# Patient Record
Sex: Female | Born: 1980 | Race: White | Hispanic: No | Marital: Single
Health system: Southern US, Community
[De-identification: ages and names within clinical notes are randomized; demographics above are authoritative.]

## PROBLEM LIST (undated history)

## (undated) HISTORY — PX: AUGMENTATION MAMMAPLASTY: SUR837

---

## 2020-06-08 DIAGNOSIS — Z803 Family history of malignant neoplasm of breast: Secondary | ICD-10-CM | POA: Insufficient documentation

## 2020-06-08 DIAGNOSIS — N941 Unspecified dyspareunia: Secondary | ICD-10-CM | POA: Insufficient documentation

## 2020-06-08 DIAGNOSIS — N946 Dysmenorrhea, unspecified: Secondary | ICD-10-CM | POA: Insufficient documentation

## 2020-07-24 ENCOUNTER — Ambulatory Visit: Payer: BC Managed Care – PPO | Admitting: Podiatry

## 2020-08-04 ENCOUNTER — Ambulatory Visit: Payer: BC Managed Care – PPO | Admitting: Podiatry

## 2020-08-17 DIAGNOSIS — I83893 Varicose veins of bilateral lower extremities with other complications: Secondary | ICD-10-CM | POA: Insufficient documentation

## 2020-09-04 ENCOUNTER — Ambulatory Visit: Payer: BC Managed Care – PPO | Admitting: Podiatry

## 2020-09-04 ENCOUNTER — Encounter: Payer: Self-pay | Admitting: Podiatry

## 2020-09-04 ENCOUNTER — Ambulatory Visit (INDEPENDENT_AMBULATORY_CARE_PROVIDER_SITE_OTHER): Payer: BC Managed Care – PPO

## 2020-09-04 ENCOUNTER — Other Ambulatory Visit: Payer: Self-pay

## 2020-09-04 DIAGNOSIS — M21612 Bunion of left foot: Secondary | ICD-10-CM

## 2020-09-04 DIAGNOSIS — E161 Other hypoglycemia: Secondary | ICD-10-CM | POA: Insufficient documentation

## 2020-09-04 DIAGNOSIS — M21611 Bunion of right foot: Secondary | ICD-10-CM | POA: Diagnosis not present

## 2020-09-04 DIAGNOSIS — F32A Depression, unspecified: Secondary | ICD-10-CM | POA: Insufficient documentation

## 2020-09-04 DIAGNOSIS — H539 Unspecified visual disturbance: Secondary | ICD-10-CM | POA: Insufficient documentation

## 2020-09-04 DIAGNOSIS — B351 Tinea unguium: Secondary | ICD-10-CM

## 2020-09-04 DIAGNOSIS — I10 Essential (primary) hypertension: Secondary | ICD-10-CM | POA: Insufficient documentation

## 2020-09-04 NOTE — Patient Instructions (Signed)
The surgery we talked about is called a "Lapidus bunionectomy"

## 2020-09-04 NOTE — Progress Notes (Signed)
Subjective:   Patient ID: Renee Garcia, female   DOB: 39 y.o.   MRN: [id number]   HPI 39 year old female presents the office today for concerns of nail fungus.  She is interested in laser therapy for this.  She has had fungus toenails for some time in shoes.  She had laser performed as well as other medication options.  Denies any pain in the nails any redness or drainage.  She does have swelling but see note below from vascular surgery.  Also she has other concerns about cognition with her feet.  Left side is worse than the right.  No recent treatment but she has seen other doctors for this and they discussed surgical options.  Patient was interested initially in a lipiplasty but when she saw another physician for this they discussed also bunionectomy it sounds like.  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 There was thrombus in some great saphenous vein tributary branches in her left medial leg.     Review of Systems  All other systems reviewed and are negative.  History reviewed. No pertinent past medical history.  History reviewed. No pertinent surgical history.   Current Outpatient Medications:  .  betamethasone dipropionate (DIPROLENE) 0.05 % ointment, Apply topically., Disp: , Rfl:  .  ketoconazole (NIZORAL) 2 % cream, , Disp: , Rfl:  .  meloxicam (MOBIC) 15 MG tablet, Take by mouth., Disp: , Rfl:  .  acarbose (PRECOSE) 50 MG tablet, acarbose 50 mg tablet Take 1 tablet 3 times a day by oral route for 90 days., Disp: , Rfl:  .  Buprenorphine HCl-Naloxone HCl 8-2 MG FILM, Suboxone 8 mg-2 mg sublingual film Place 1 film twice a day by sublingual route for 30 days., Disp: , Rfl:  .  buPROPion (WELLBUTRIN XL) 150 MG 24 hr tablet, Take 150 mg by mouth daily., Disp: , Rfl:  .   chlorhexidine (PERIDEX) 0.12 % solution, Peridex 0.12 % mouthwash, Disp: , Rfl:  .  cyclobenzaprine (FLEXERIL) 10 MG tablet, Take by mouth., Disp: , Rfl:  .  ibuprofen (ADVIL) 800 MG tablet, Take by mouth., Disp: , Rfl:  .  nebivolol (BYSTOLIC) 5 MG tablet, Bystolic 5 mg tablet TAKE 1 TABLET BY MOUTH TWICE A DAY AFTER MEALS, Disp: , Rfl:  .  neomycin-polymyxin b-dexamethasone (MAXITROL) 3.5-10000-0.1 OINT, SMARTSIG:Sparingly In Eye(s) Twice Daily, Disp: , Rfl:  .  tretinoin (RETIN-A) 0.025 % gel, Retin-A 0.025 % topical gel APPLY TO AFFECTED AREA EVERY DAY AT BEDTIME, Disp: , Rfl:  .  triamcinolone cream (KENALOG) 0.5 %, triamcinolone acetonide 0.5 % topical cream Apply 1 application 3 times a day by topical route for 30 days., Disp: , Rfl:   Allergies  Allergen Reactions  . Sulfa Antibiotics Hives         Objective:  Physical Exam  General: AAO x3, NAD  Dermatological: The bilateral hallux nails there is mild hypertrophy noted.  There is yellow to brown discoloration.  There is no drainage or pus or any surrounding edema, erythema.  Is no open lesions.  Vascular: Dorsalis Pedis artery and Posterior Tibial artery pedal pulses are 2/4 bilateral with immedate capillary fill time.   There is no pain with calf compression, swelling, warmth, erythema.   Neruologic: Grossly intact via light touch bilateral.   Musculoskeletal: There is a moderate bunion deformity present  on the left side with hypermobility present first ray.  There is no pain or crepitation with ankle joint range of motion.  Flexor, extensor tendons appear to be intact.  Muscular strength 5/5 in all groups tested bilateral.  Gait: Unassisted, Nonantalgic.       Assessment:   Onychomycosis, left foot symptomatic bunion    Plan:  -Treatment options discussed including all alternatives, risks, and complications -Etiology of symptoms were discussed -Discussed treatment options for nail fungus and she was to proceed with  laser.  Discussed with success rates this is not a guarantee of resolution of symptoms.  She can be scheduled for laser. -Regards the bunions x-rays were obtained and reviewed.  Moderate bunion deformities present.  However given her hypermobility the first ray in the left side I would recommend a Lapidus bunionectomy.  Discussed the surgical's postoperative course.  Vivi Barrack DPM

## 2020-09-06 DIAGNOSIS — M21612 Bunion of left foot: Secondary | ICD-10-CM | POA: Insufficient documentation

## 2020-09-06 DIAGNOSIS — M21611 Bunion of right foot: Secondary | ICD-10-CM | POA: Insufficient documentation

## 2020-09-06 DIAGNOSIS — B351 Tinea unguium: Secondary | ICD-10-CM | POA: Insufficient documentation

## 2020-09-11 ENCOUNTER — Other Ambulatory Visit: Payer: BC Managed Care – PPO

## 2020-09-15 ENCOUNTER — Other Ambulatory Visit: Payer: Self-pay | Admitting: Gynecology

## 2020-09-15 DIAGNOSIS — L299 Pruritus, unspecified: Secondary | ICD-10-CM

## 2020-09-18 ENCOUNTER — Other Ambulatory Visit: Payer: Self-pay | Admitting: Gynecology

## 2020-09-18 DIAGNOSIS — L299 Pruritus, unspecified: Secondary | ICD-10-CM

## 2020-09-19 ENCOUNTER — Other Ambulatory Visit: Payer: Self-pay

## 2020-09-19 ENCOUNTER — Ambulatory Visit (INDEPENDENT_AMBULATORY_CARE_PROVIDER_SITE_OTHER): Payer: BC Managed Care – PPO | Admitting: *Deleted

## 2020-09-19 DIAGNOSIS — B351 Tinea unguium: Secondary | ICD-10-CM

## 2020-09-19 NOTE — Progress Notes (Signed)
Patient presents today for the 1st laser treatment. Diagnosed with mycotic nail infection by Dr. Ardelle Anton.   Toenail most affected hallux nails bilateral, mild discoloration.  All other systems are negative.  Nails were filed thin. Laser therapy was administered to 1st toenails bilateral and patient tolerated the treatment well. All safety precautions were in place.    Follow up in 4 weeks for laser # 2.  Picture of nails taken today to document visual progress

## 2020-09-19 NOTE — Patient Instructions (Signed)

## 2020-10-03 ENCOUNTER — Other Ambulatory Visit: Payer: Self-pay | Admitting: Gynecology

## 2020-10-03 ENCOUNTER — Ambulatory Visit
Admission: RE | Admit: 2020-10-03 | Discharge: 2020-10-03 | Disposition: A | Discharge status: Home / Self Care | Payer: BC Managed Care – PPO | Source: Ambulatory Visit | Attending: Gynecology | Admitting: Gynecology

## 2020-10-03 ENCOUNTER — Other Ambulatory Visit: Payer: Self-pay

## 2020-10-03 DIAGNOSIS — L299 Pruritus, unspecified: Secondary | ICD-10-CM

## 2020-10-03 IMAGING — MG MM  DIGITAL DIAGNOSTIC BREAST BILAT IMPLANT W/ TOMO W/ CAD
7 of 12 series · 7 of 28 positions shown · non-contrast
Comparison: None.
COMPARISON: None.

Addendum:
CLINICAL DATA: 38-year-old female presenting for baseline mammogram
to evaluate itching in the superior left breast which fluctuates
with her cycle and has been occurring for 2 years. The patient's
mother was recently diagnosed with a triple negative breast cancer,
and she may have a paternal aunt who had history of ovarian cancer.

EXAM:
DIGITAL DIAGNOSTIC BILATERAL MAMMOGRAM WITH IMPLANTS, CAD AND TOMO
The patient has retropectoral implants. Standard and implant
displaced views were performed.
BILATERAL BREAST ULTRASOUND

[L CC]
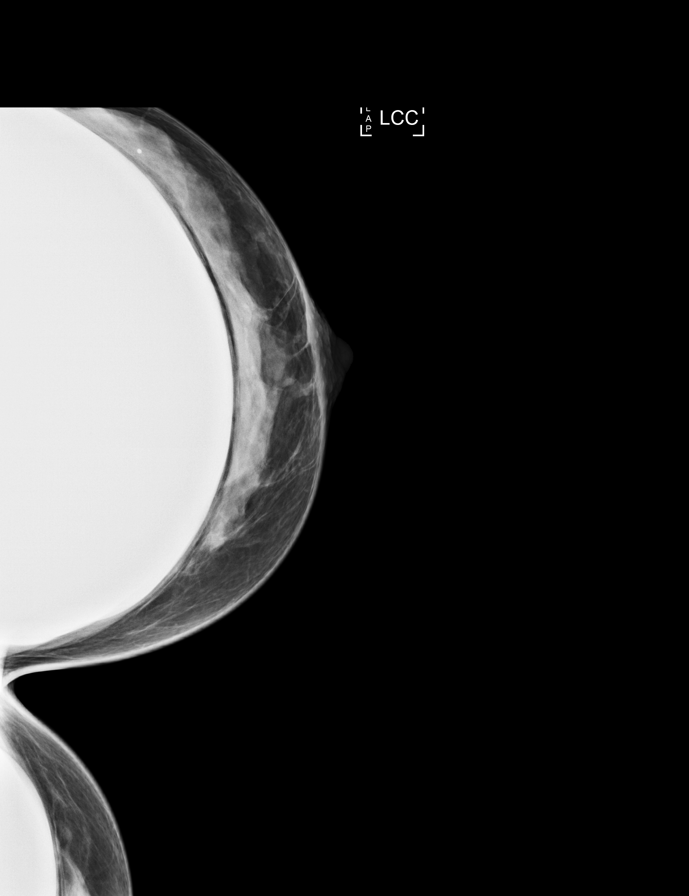

[R CC]
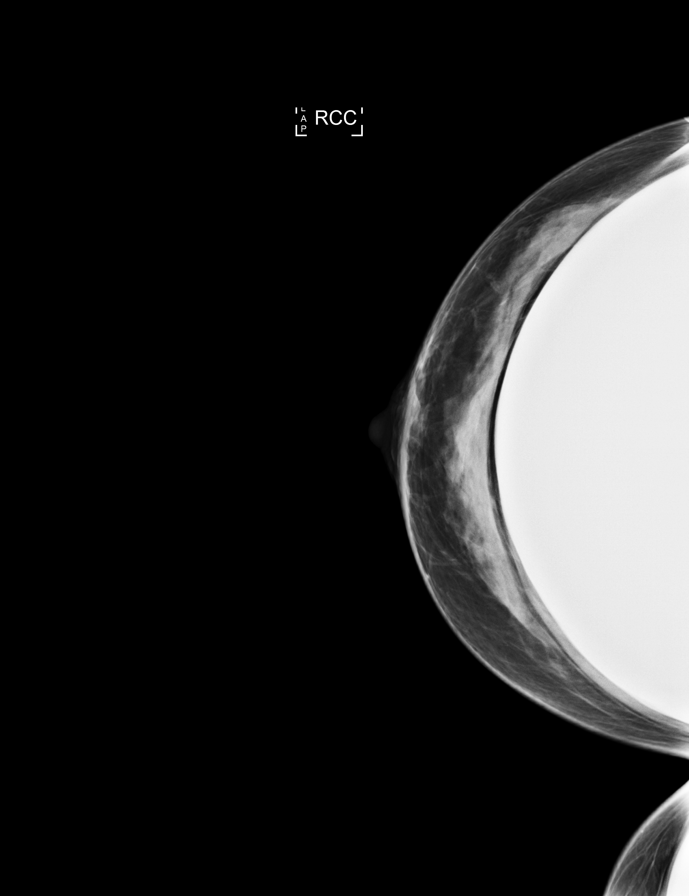

[L MLO]
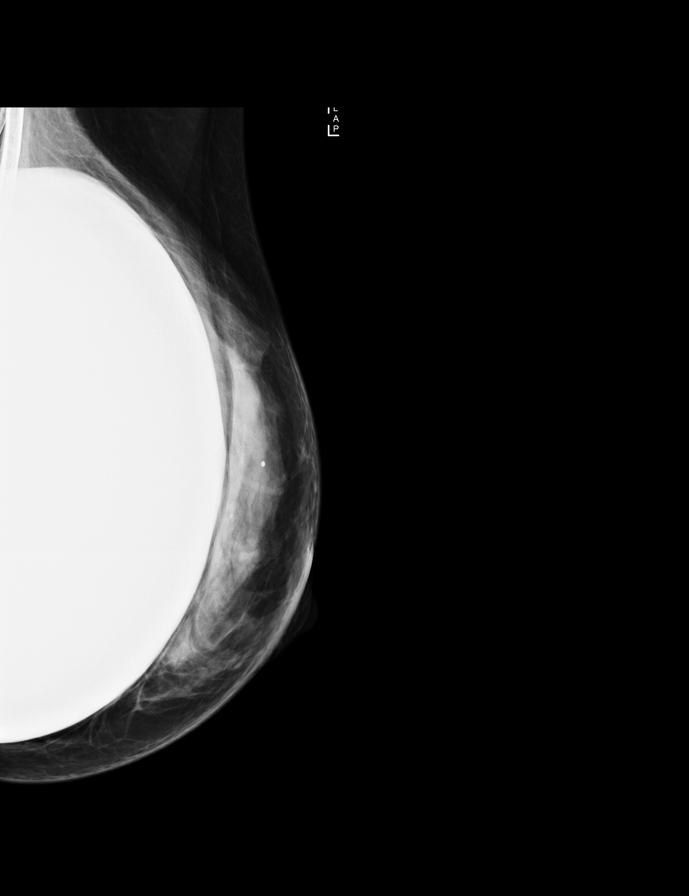

[R MLO]
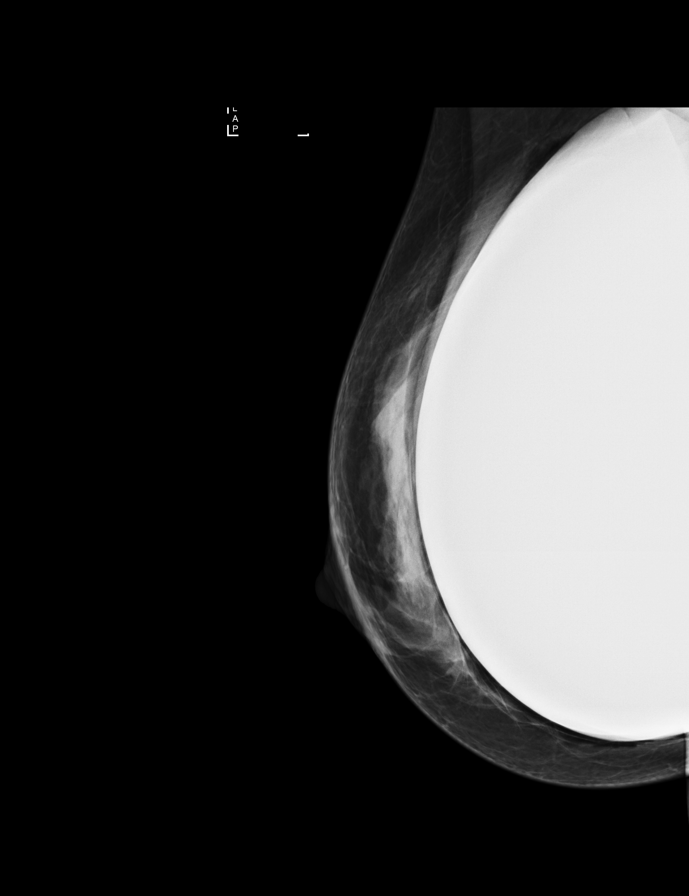

[L MLO synth-2D]
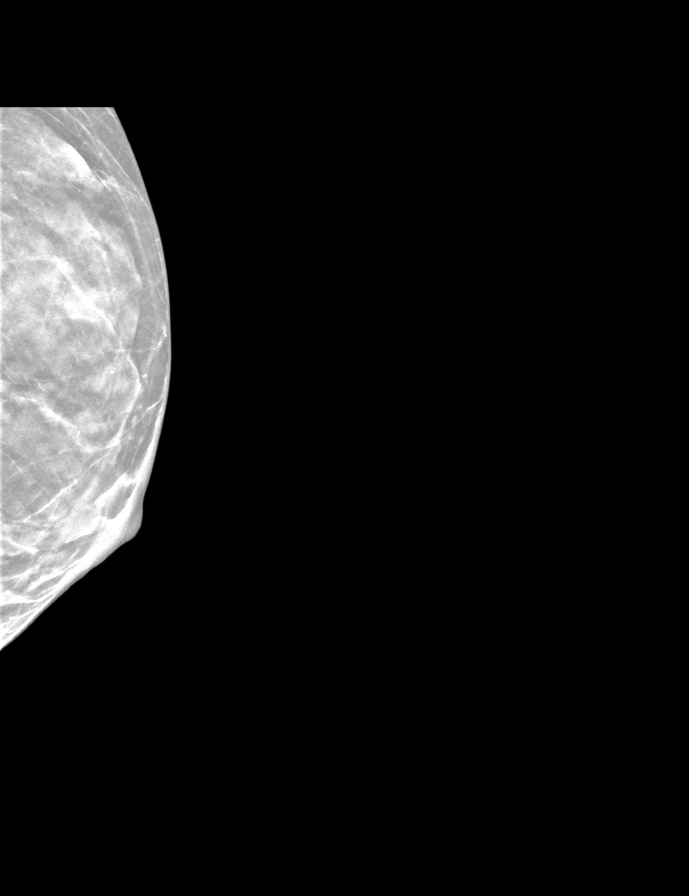

[R CC synth-2D]
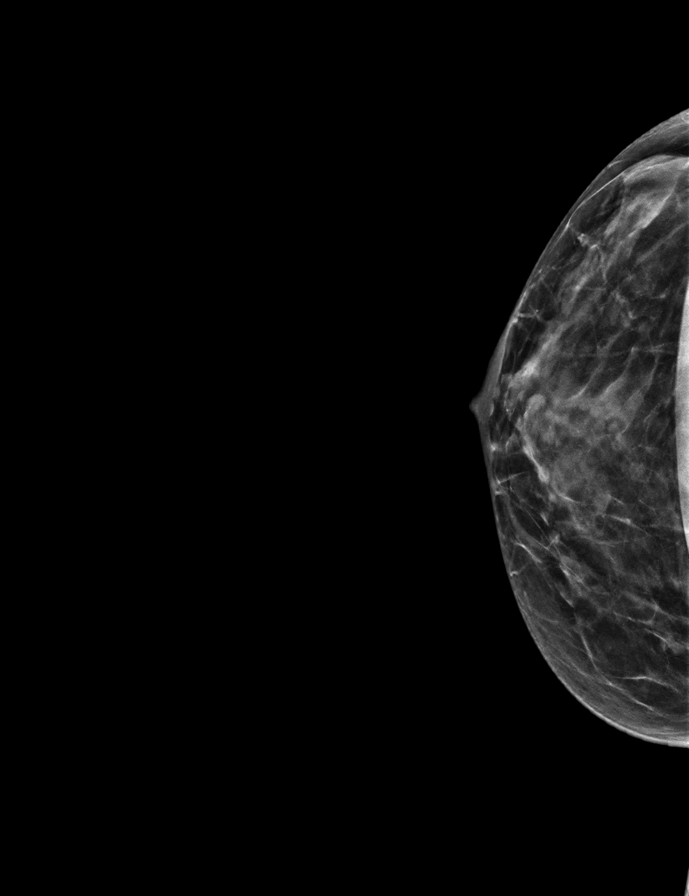

[L CC synth-2D]
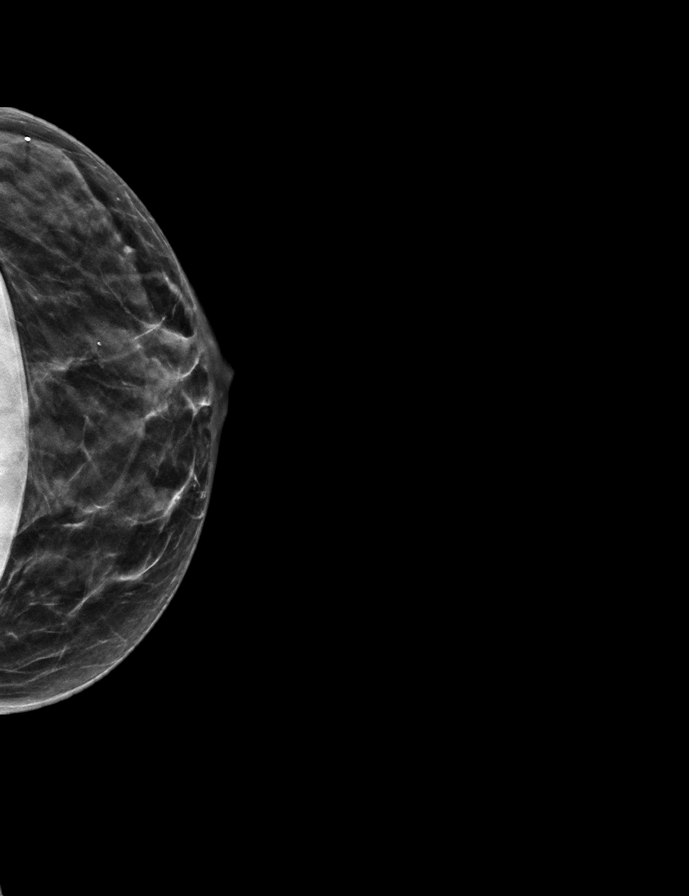

[7 of 28 positions shown; findings below may reference images not displayed]

ACR Breast Density Category c: The breast tissue is heterogeneously
dense, which may obscure small masses.
FINDINGS: In the superior retroareolar right breast there is an obscured oval
mass measuring approximately 5 mm. No other suspicious
calcifications, masses or areas of distortion are seen in the
bilateral breasts.

Mammographic images were processed with CAD.

Ultrasound targeted to the superior left breast demonstrates normal
fibroglandular tissue. No suspicious masses or areas of shadowing
are identified.

Ultrasound targeted to the superior aspect of the right breast
demonstrates 2 anechoic oval circumscribed masses measuring 4-5 mm
consistent with benign cysts.
IMPRESSION: 1. There are no suspicious mammographic or targeted sonographic
abnormalities in the region of itching in the superior left breast.

2. There are a few benign cysts in the superior aspect of the right
breast.

3.  No mammographic evidence of malignancy in the bilateral breasts.

RECOMMENDATION:
1. Clinical follow-up recommended for the region of chronic
intermittent itching in the superior left breast. Any further workup
should be based on clinical grounds.

2. Consider genetics assessment to determine the patient's lifetime
risk of breast cancer given her family history, if this has not
already been performed. Per American Cancer Society guidelines, if
the patient has a calculated lifetime risk of developing breast
cancer of greater than 20%, annual screening MRI of the breasts
would be recommended at the time of screening mammography. Given her
family history and breast tissue density, if she does not qualify
for annual high risk MRI, she may opt for annual abbreviated MRI,
understanding that this is an out of pocket expense and not covered
by insurance. This was discussed with the patient.

3. The patient states that she is concerned with implant rupture and
would like additional imaging to evaluate for this. I explained that
we recommend implant rupture protocol MRI. We can do this MRI with
contrast to screen for malignancy as well as evaluate for rupture.

I have discussed the findings and recommendations with the patient.
If applicable, a reminder letter will be sent to the patient
regarding the next appointment.

BI-RADS CATEGORY  2: Benign.

ADDENDUM:
In addition to the recommendations in the original report, screening
mammogram recommended in one year.(Code:[M0])

*** End of Addendum ***
ACR Breast Density Category c: The breast tissue is heterogeneously
dense, which may obscure small masses.
FINDINGS: In the superior retroareolar right breast there is an obscured oval
mass measuring approximately 5 mm. No other suspicious
calcifications, masses or areas of distortion are seen in the
bilateral breasts.

Mammographic images were processed with CAD.

Ultrasound targeted to the superior left breast demonstrates normal
fibroglandular tissue. No suspicious masses or areas of shadowing
are identified.

Ultrasound targeted to the superior aspect of the right breast
demonstrates 2 anechoic oval circumscribed masses measuring 4-5 mm
consistent with benign cysts.
IMPRESSION: 1. There are no suspicious mammographic or targeted sonographic
abnormalities in the region of itching in the superior left breast.

2. There are a few benign cysts in the superior aspect of the right
breast.

3.  No mammographic evidence of malignancy in the bilateral breasts.

RECOMMENDATION:
1. Clinical follow-up recommended for the region of chronic
intermittent itching in the superior left breast. Any further workup
should be based on clinical grounds.

2. Consider genetics assessment to determine the patient's lifetime
risk of breast cancer given her family history, if this has not
already been performed. Per American Cancer Society guidelines, if
the patient has a calculated lifetime risk of developing breast
cancer of greater than 20%, annual screening MRI of the breasts
would be recommended at the time of screening mammography. Given her
family history and breast tissue density, if she does not qualify
for annual high risk MRI, she may opt for annual abbreviated MRI,
understanding that this is an out of pocket expense and not covered
by insurance. This was discussed with the patient.

3. The patient states that she is concerned with implant rupture and
would like additional imaging to evaluate for this. I explained that
we recommend implant rupture protocol MRI. We can do this MRI with
contrast to screen for malignancy as well as evaluate for rupture.

I have discussed the findings and recommendations with the patient.
If applicable, a reminder letter will be sent to the patient
regarding the next appointment.

BI-RADS CATEGORY  2: Benign.

## 2020-10-03 IMAGING — US A
1 series · 10 of 10 positions shown · non-contrast
Comparison: None.
COMPARISON: None.

Addendum:
CLINICAL DATA: 38-year-old female presenting for baseline mammogram
to evaluate itching in the superior left breast which fluctuates
with her cycle and has been occurring for 2 years. The patient's
mother was recently diagnosed with a triple negative breast cancer,
and she may have a paternal aunt who had history of ovarian cancer.

EXAM:
DIGITAL DIAGNOSTIC BILATERAL MAMMOGRAM WITH IMPLANTS, CAD AND TOMO
The patient has retropectoral implants. Standard and implant
displaced views were performed.
BILATERAL BREAST ULTRASOUND

[Series 1: a · 0.06mm/px · 10 of 10 slices shown]
[im 1/10]
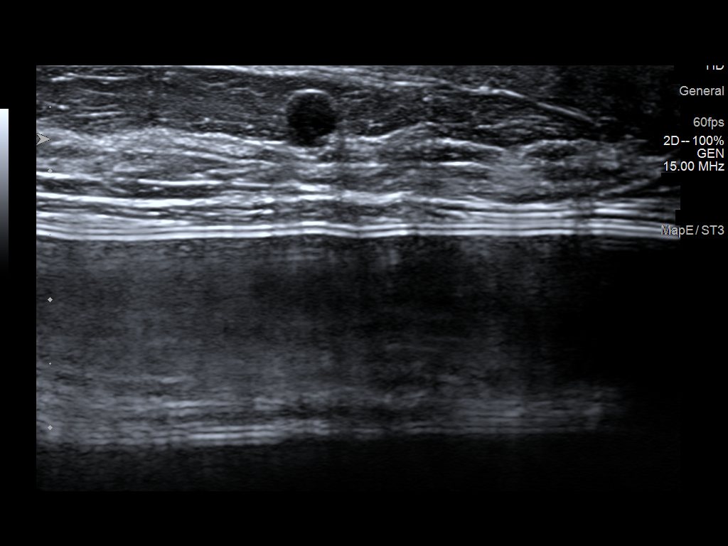
[im 2/10]
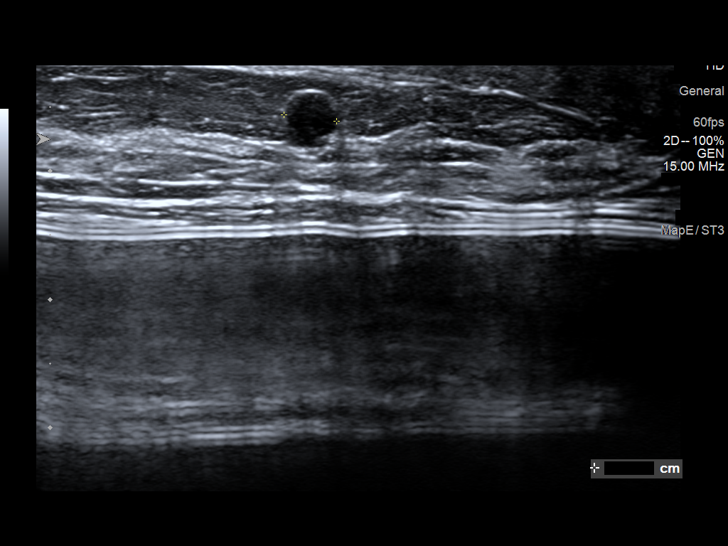
[im 3/10]
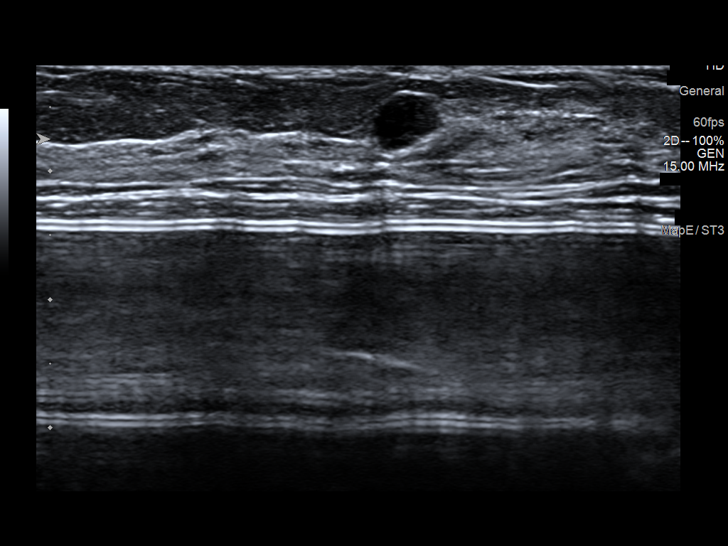
[im 4/10]
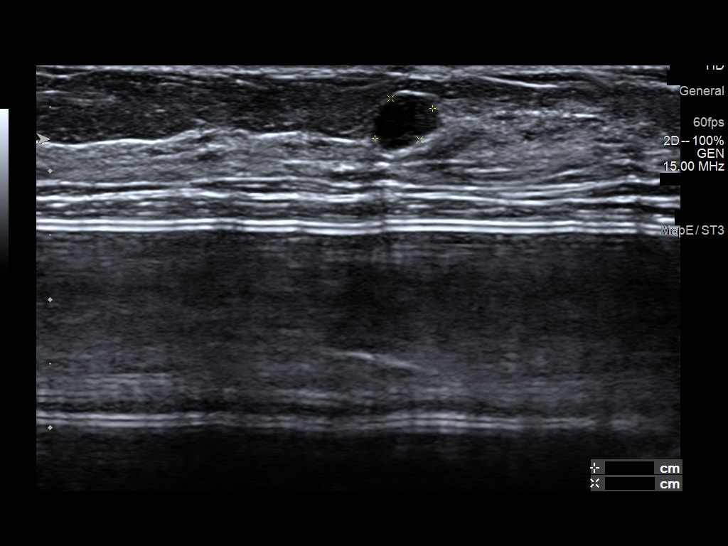
[im 5/10]
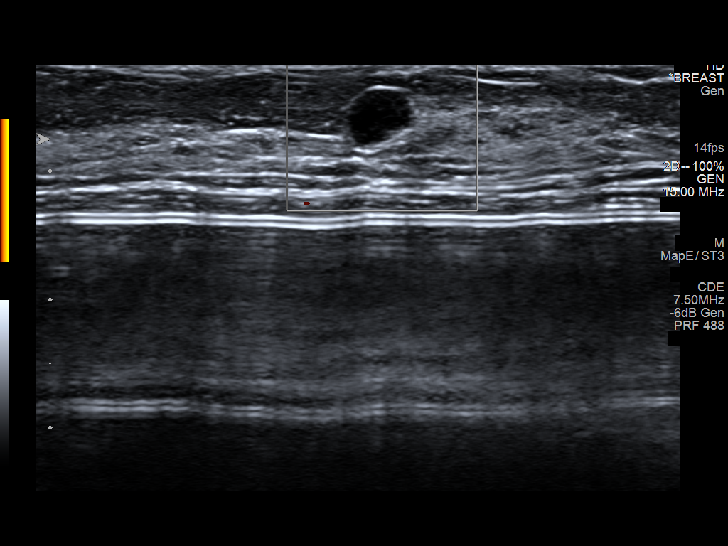
[im 6/10]
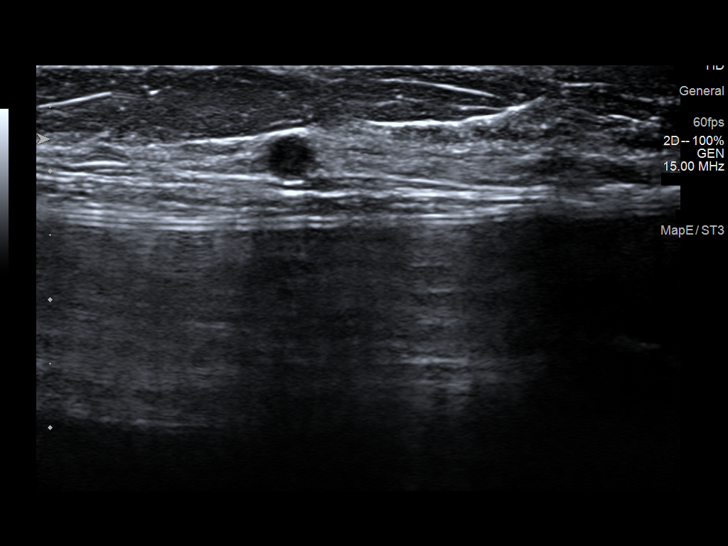
[im 7/10]
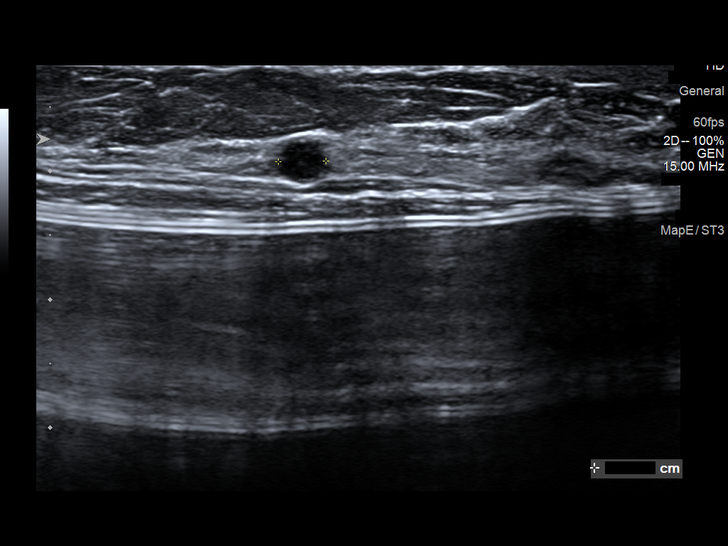
[im 8/10]
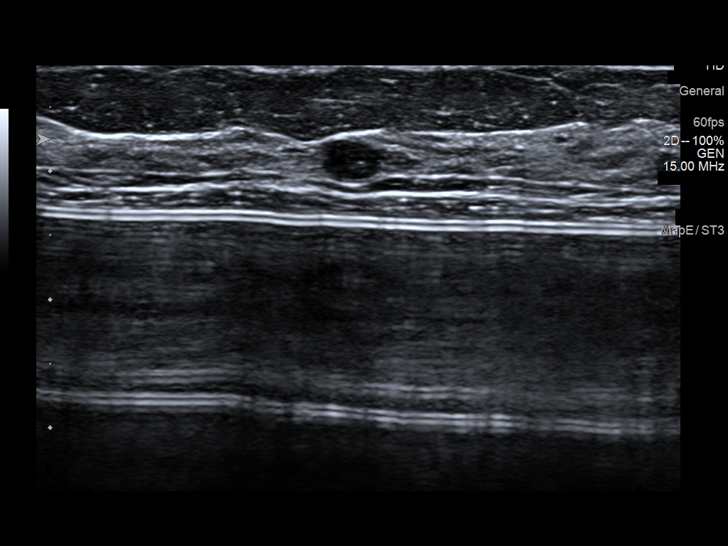
[im 9/10]
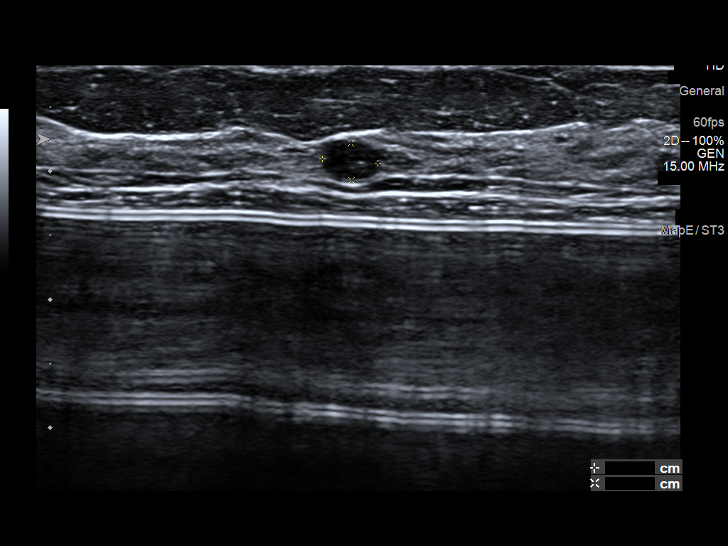
[im 10/10]
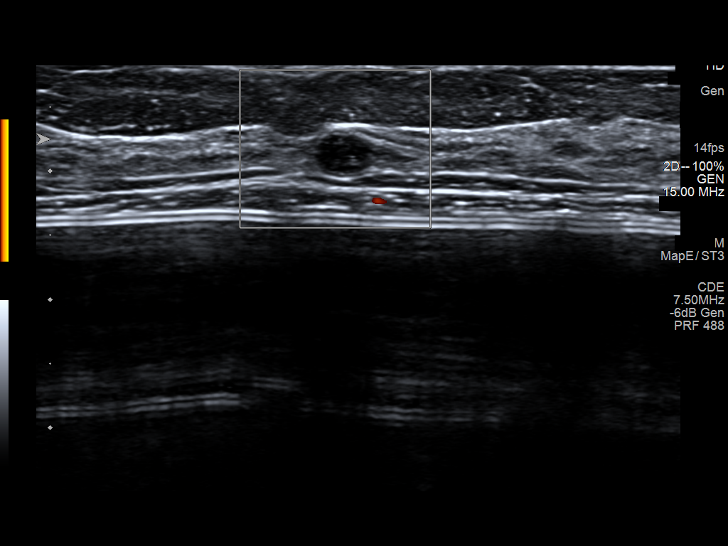

[10 of 10 positions shown; findings below may reference images not displayed]

ACR Breast Density Category c: The breast tissue is heterogeneously
dense, which may obscure small masses.
FINDINGS: In the superior retroareolar right breast there is an obscured oval
mass measuring approximately 5 mm. No other suspicious
calcifications, masses or areas of distortion are seen in the
bilateral breasts.

Mammographic images were processed with CAD.

Ultrasound targeted to the superior left breast demonstrates normal
fibroglandular tissue. No suspicious masses or areas of shadowing
are identified.

Ultrasound targeted to the superior aspect of the right breast
demonstrates 2 anechoic oval circumscribed masses measuring 4-5 mm
consistent with benign cysts.
IMPRESSION: 1. There are no suspicious mammographic or targeted sonographic
abnormalities in the region of itching in the superior left breast.

2. There are a few benign cysts in the superior aspect of the right
breast.

3.  No mammographic evidence of malignancy in the bilateral breasts.

RECOMMENDATION:
1. Clinical follow-up recommended for the region of chronic
intermittent itching in the superior left breast. Any further workup
should be based on clinical grounds.

2. Consider genetics assessment to determine the patient's lifetime
risk of breast cancer given her family history, if this has not
already been performed. Per American Cancer Society guidelines, if
the patient has a calculated lifetime risk of developing breast
cancer of greater than 20%, annual screening MRI of the breasts
would be recommended at the time of screening mammography. Given her
family history and breast tissue density, if she does not qualify
for annual high risk MRI, she may opt for annual abbreviated MRI,
understanding that this is an out of pocket expense and not covered
by insurance. This was discussed with the patient.

3. The patient states that she is concerned with implant rupture and
would like additional imaging to evaluate for this. I explained that
we recommend implant rupture protocol MRI. We can do this MRI with
contrast to screen for malignancy as well as evaluate for rupture.

I have discussed the findings and recommendations with the patient.
If applicable, a reminder letter will be sent to the patient
regarding the next appointment.

BI-RADS CATEGORY  2: Benign.

ADDENDUM:
In addition to the recommendations in the original report, screening
mammogram recommended in one year.(Code:[M0])

*** End of Addendum ***
ACR Breast Density Category c: The breast tissue is heterogeneously
dense, which may obscure small masses.
FINDINGS: In the superior retroareolar right breast there is an obscured oval
mass measuring approximately 5 mm. No other suspicious
calcifications, masses or areas of distortion are seen in the
bilateral breasts.

Mammographic images were processed with CAD.

Ultrasound targeted to the superior left breast demonstrates normal
fibroglandular tissue. No suspicious masses or areas of shadowing
are identified.

Ultrasound targeted to the superior aspect of the right breast
demonstrates 2 anechoic oval circumscribed masses measuring 4-5 mm
consistent with benign cysts.
IMPRESSION: 1. There are no suspicious mammographic or targeted sonographic
abnormalities in the region of itching in the superior left breast.

2. There are a few benign cysts in the superior aspect of the right
breast.

3.  No mammographic evidence of malignancy in the bilateral breasts.

RECOMMENDATION:
1. Clinical follow-up recommended for the region of chronic
intermittent itching in the superior left breast. Any further workup
should be based on clinical grounds.

2. Consider genetics assessment to determine the patient's lifetime
risk of breast cancer given her family history, if this has not
already been performed. Per American Cancer Society guidelines, if
the patient has a calculated lifetime risk of developing breast
cancer of greater than 20%, annual screening MRI of the breasts
would be recommended at the time of screening mammography. Given her
family history and breast tissue density, if she does not qualify
for annual high risk MRI, she may opt for annual abbreviated MRI,
understanding that this is an out of pocket expense and not covered
by insurance. This was discussed with the patient.

3. The patient states that she is concerned with implant rupture and
would like additional imaging to evaluate for this. I explained that
we recommend implant rupture protocol MRI. We can do this MRI with
contrast to screen for malignancy as well as evaluate for rupture.

I have discussed the findings and recommendations with the patient.
If applicable, a reminder letter will be sent to the patient
regarding the next appointment.

BI-RADS CATEGORY  2: Benign.

## 2020-10-06 ENCOUNTER — Other Ambulatory Visit: Payer: BC Managed Care – PPO

## 2020-10-11 ENCOUNTER — Other Ambulatory Visit: Payer: Self-pay | Admitting: Gynecology

## 2020-10-11 DIAGNOSIS — Z9882 Breast implant status: Secondary | ICD-10-CM

## 2020-10-23 ENCOUNTER — Other Ambulatory Visit: Payer: BC Managed Care – PPO

## 2020-10-30 ENCOUNTER — Other Ambulatory Visit: Payer: BC Managed Care – PPO

## 2021-06-03 ENCOUNTER — Other Ambulatory Visit: Payer: BC Managed Care – PPO

## 2021-06-08 ENCOUNTER — Other Ambulatory Visit: Payer: Self-pay | Admitting: Gynecology

## 2021-06-08 DIAGNOSIS — N6310 Unspecified lump in the right breast, unspecified quadrant: Secondary | ICD-10-CM

## 2021-06-08 DIAGNOSIS — T8543XA Leakage of breast prosthesis and implant, initial encounter: Secondary | ICD-10-CM

## 2021-06-30 ENCOUNTER — Other Ambulatory Visit: Payer: Self-pay

## 2021-06-30 ENCOUNTER — Ambulatory Visit
Admission: RE | Admit: 2021-06-30 | Discharge: 2021-06-30 | Disposition: A | Discharge status: Home / Self Care | Payer: BC Managed Care – PPO | Source: Ambulatory Visit | Attending: Gynecology | Admitting: Gynecology

## 2021-06-30 DIAGNOSIS — N6310 Unspecified lump in the right breast, unspecified quadrant: Secondary | ICD-10-CM

## 2021-06-30 DIAGNOSIS — T8543XA Leakage of breast prosthesis and implant, initial encounter: Secondary | ICD-10-CM

## 2021-06-30 MED ORDER — GADOBUTROL 1 MMOL/ML IV SOLN
5.0000 mL | Freq: Once | INTRAVENOUS | Status: AC | PRN
  Administered 2021-06-30: 5 mL via INTRAVENOUS

## 2021-07-03 ENCOUNTER — Other Ambulatory Visit: Payer: Self-pay | Admitting: Gynecology

## 2021-07-03 DIAGNOSIS — N6310 Unspecified lump in the right breast, unspecified quadrant: Secondary | ICD-10-CM

## 2021-07-03 DIAGNOSIS — N6320 Unspecified lump in the left breast, unspecified quadrant: Secondary | ICD-10-CM

## 2021-07-03 DIAGNOSIS — T8543XA Leakage of breast prosthesis and implant, initial encounter: Secondary | ICD-10-CM

## 2021-07-04 ENCOUNTER — Other Ambulatory Visit: Payer: Self-pay

## 2021-07-04 ENCOUNTER — Ambulatory Visit
Admission: RE | Admit: 2021-07-04 | Discharge: 2021-07-04 | Disposition: A | Discharge status: Home / Self Care | Payer: BC Managed Care – PPO | Source: Ambulatory Visit | Attending: Gynecology | Admitting: Gynecology

## 2021-07-04 DIAGNOSIS — N6310 Unspecified lump in the right breast, unspecified quadrant: Secondary | ICD-10-CM

## 2021-07-04 DIAGNOSIS — N6320 Unspecified lump in the left breast, unspecified quadrant: Secondary | ICD-10-CM

## 2021-07-04 DIAGNOSIS — T8543XA Leakage of breast prosthesis and implant, initial encounter: Secondary | ICD-10-CM

## 2021-07-04 IMAGING — MR MR BREAST BILAT WO/W CM
6 of 9 series · 19 of 48 positions shown · IV contrast (gadavist)
Comparison: No prior MRI. Prior mammography and bilateral breast
ultrasound [DATE].

CLINICAL DATA: 39-year-old with retropectoral silicone implants.
Possible palpable breast lumps. Family history breast cancer in
mother who has a triple negative breast cancer.

LABS:  Not applicable.
EXAM:
BILATERAL BREAST MRI WITH AND WITHOUT CONTRAST
TECHNIQUE: Multiplanar, multisequence MR images of both breasts were obtained
prior to and following the intravenous administration of 5 ml of
Gadavist. Standard breast implant sequences were also obtained.

[Series 2: STIR · axial · 4.0mm · 0.66mm/px · z∈[-107,+69]mm · 3 of 43 slices shown (1 of 3)]
[im 1/43]
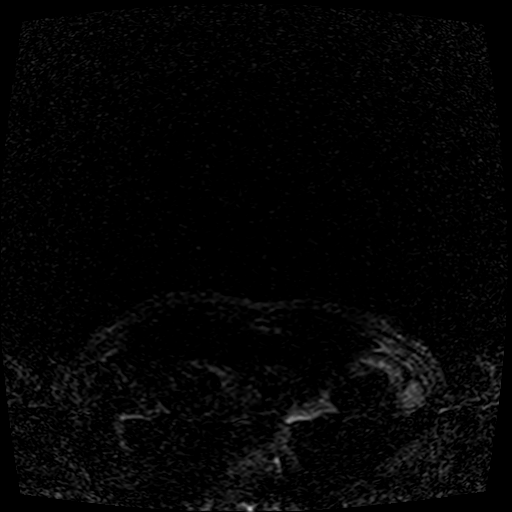
[im 22/43]
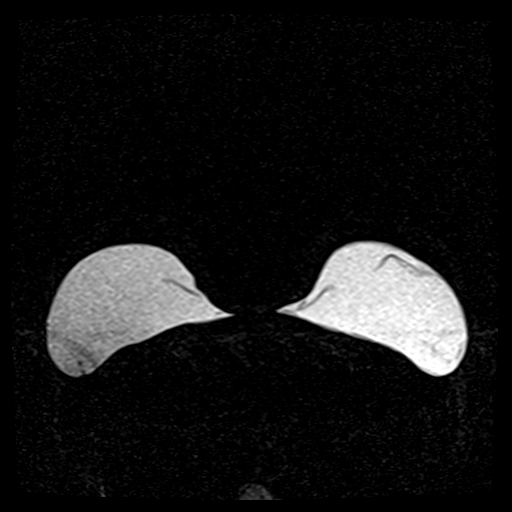
[im 43/43]
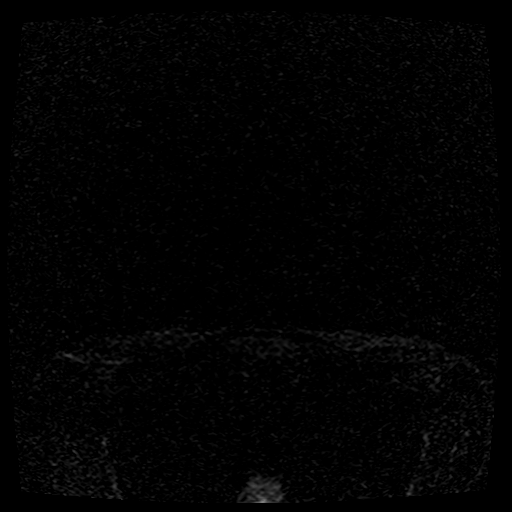

[Series 3: T2 fat-sat · axial · 4.0mm · 0.74mm/px · z∈[-112,+48]mm · 4 of 41 slices shown (1 of 3)]
[im 1/41]
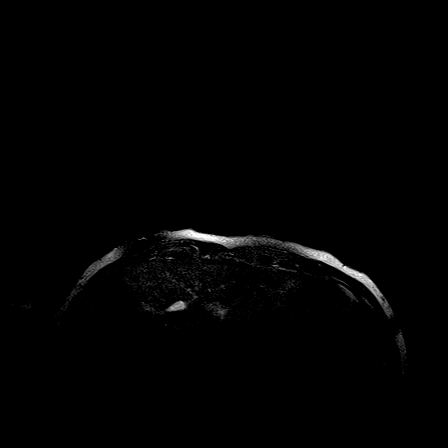
[im 14/41]
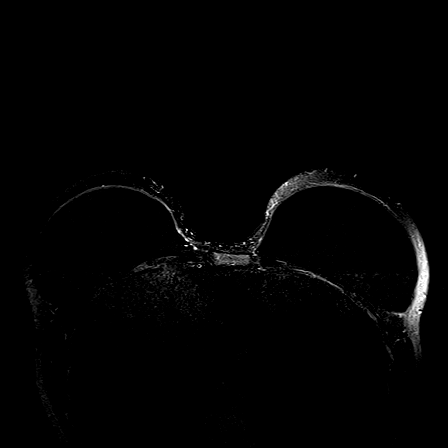
[im 27/41]
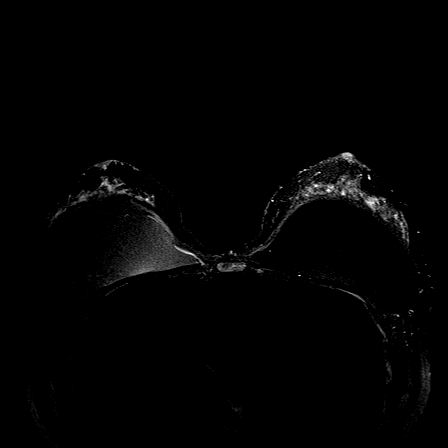
[im 41/41]
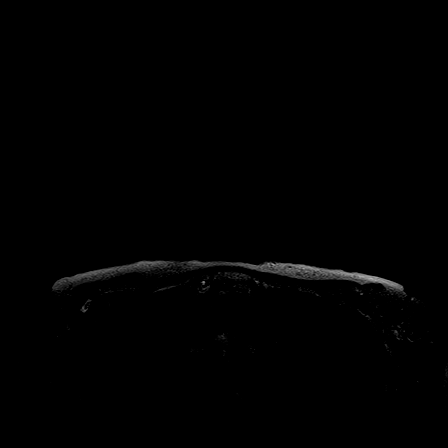

[Series 5: STIR · sagittal · 4.0mm · 0.47mm/px · 3 of 34 slices shown (2 of 3)]
[im 1/34]
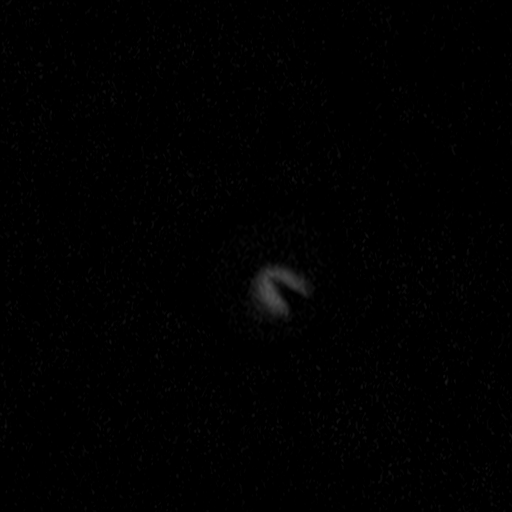
[im 17/34]
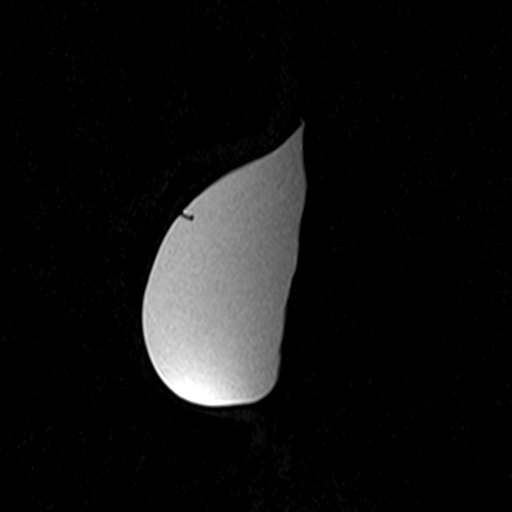
[im 34/34]
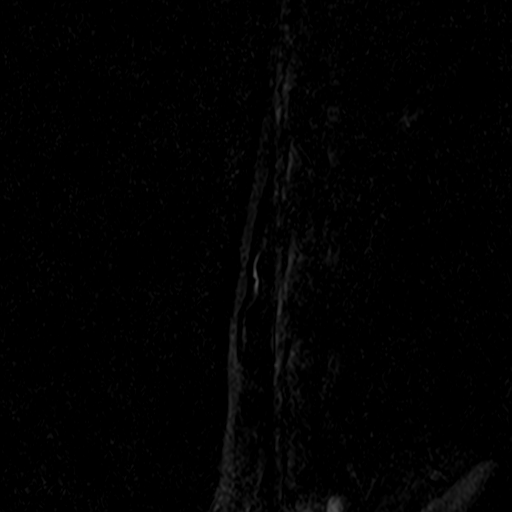

[Series 6: STIR · sagittal · 4.0mm · 0.47mm/px · 3 of 34 slices shown (3 of 3)]
[im 1/34]
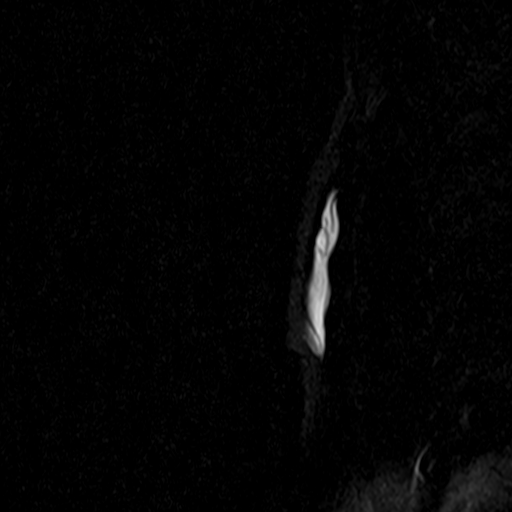
[im 17/34]
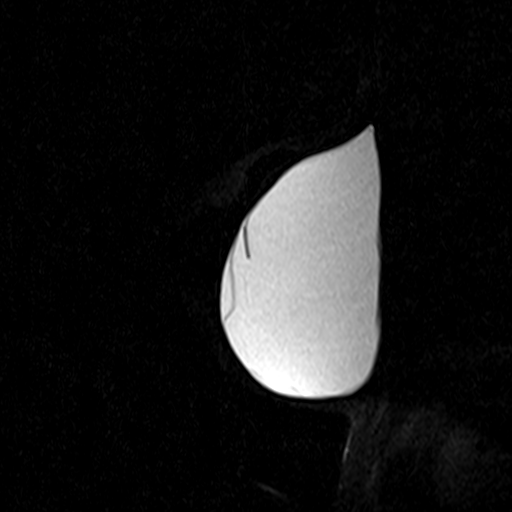
[im 34/34]
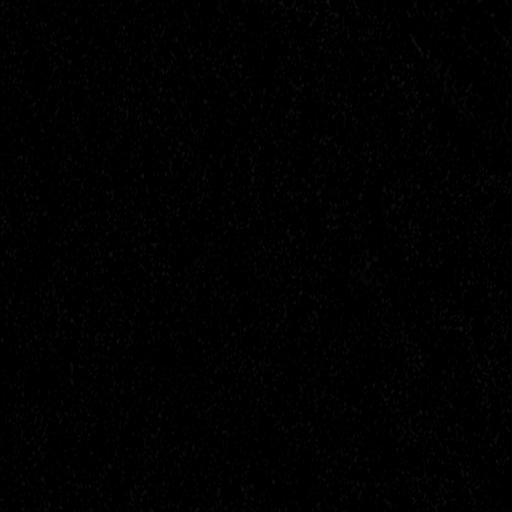

[Series 9: T2 fat-sat · sagittal · 4.0mm · 0.47mm/px · 3 of 34 slices shown (2 of 3)]
[im 1/34]
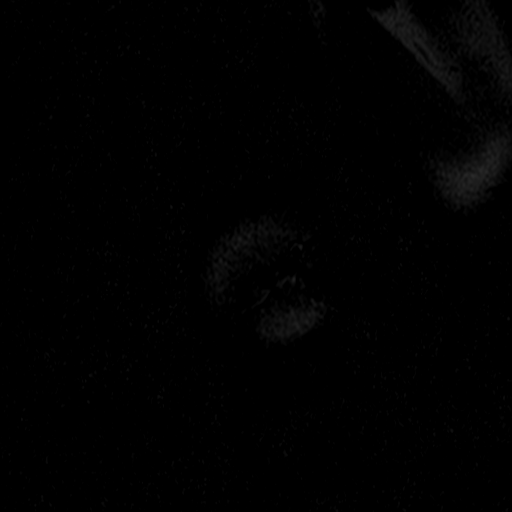
[im 17/34]
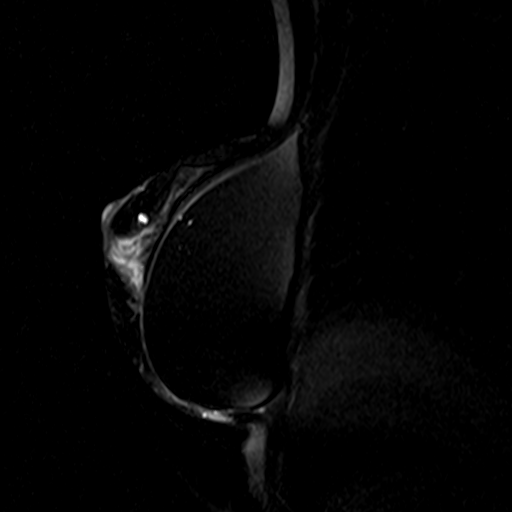
[im 34/34]
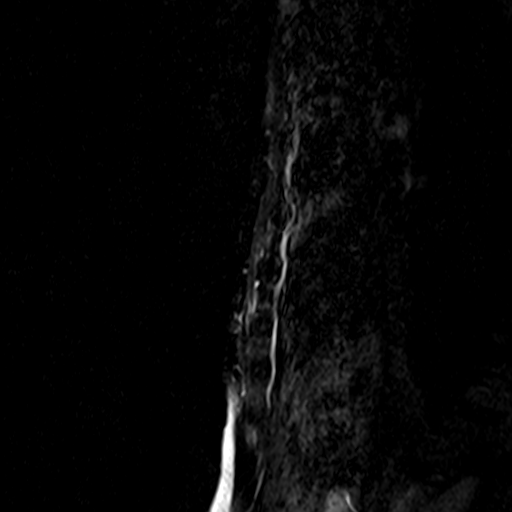

[Series 10: T2 fat-sat · sagittal · 4.0mm · 0.47mm/px · 3 of 41 slices shown (3 of 3)]
[im 1/41]
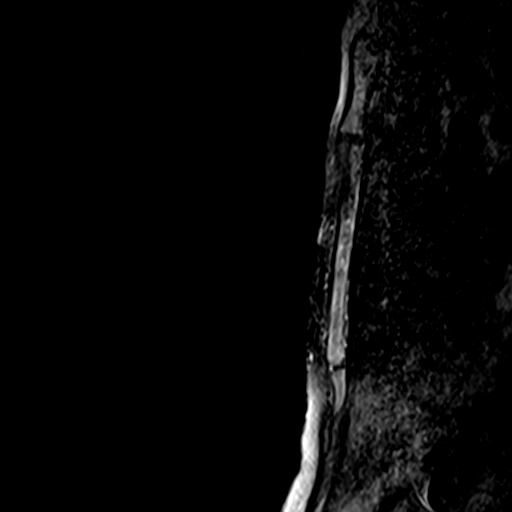
[im 14/41]
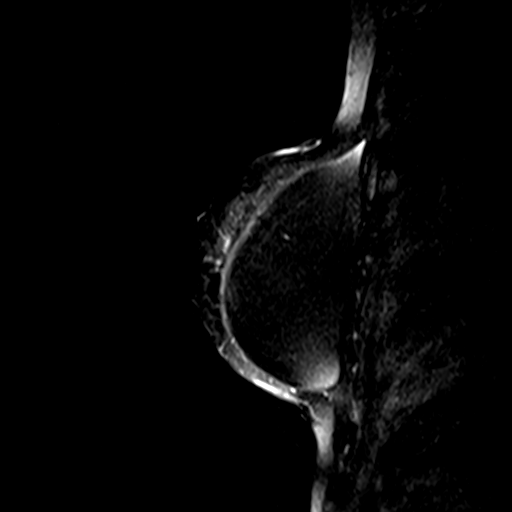
[im 27/41]
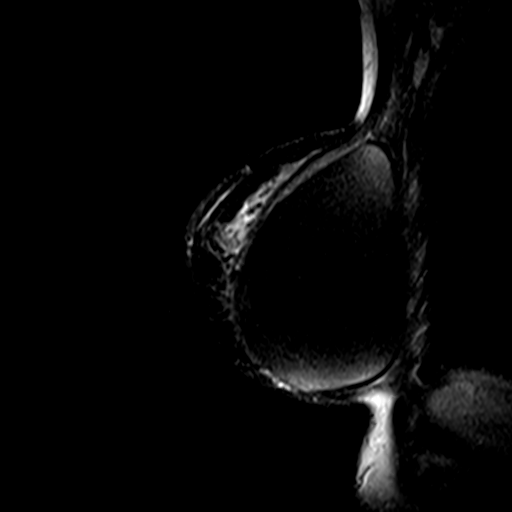

[19 of 48 positions shown; findings below may reference images not displayed]

Three-dimensional MR images were rendered by post-processing of the
original MR data on an independent workstation. The
three-dimensional MR images were interpreted, and findings are
reported in the following complete MRI report for this study. Three
dimensional images were evaluated at the independent interpreting
workstation using the DynaCAD thin client.
FINDINGS: Breast composition: d. Extreme fibroglandular tissue.

Background parenchymal enhancement: Moderate.

Right breast: No mass or abnormal enhancement.

Minimal findings of intracapsular implant rupture ("keyhole" sign on
water saturated/silicone excited sagittal image 11 and on axial
water saturated/silicone excited image 19). No evidence of
extracapsular implant rupture.

Left breast: No mass or abnormal enhancement.

Minimal findings of extracapsular rupture posteriorly (water
saturated/silicone excited sagittal image 8). Associated
intracapsular implant rupture ("keyhole" sign on water
saturated/silicone excited sagittal images 3, 20, 24, 29).

Lymph nodes: No pathologic lymphadenopathy

Ancillary findings:  None.
IMPRESSION: 1. Minimal extracapsular rupture of the LEFT breast implant
posteriorly. There is also intracapsular implant rupture on the
LEFT.
2. Minimal intracapsular rupture of the RIGHT breast implant.
3. No mass or abnormal enhancement involving either breast.
4. No pathologic lymphadenopathy.

RECOMMENDATION:
Annual BILATERAL mammography which is due in late [REDACTED] or
[DATE].

BI-RADS CATEGORY  2: Benign.

## 2021-08-16 ENCOUNTER — Other Ambulatory Visit: Payer: Self-pay | Admitting: Neurology

## 2022-02-13 ENCOUNTER — Other Ambulatory Visit: Payer: Self-pay | Admitting: Surgery

## 2022-02-13 DIAGNOSIS — Z1231 Encounter for screening mammogram for malignant neoplasm of breast: Secondary | ICD-10-CM

## 2022-03-15 ENCOUNTER — Ambulatory Visit: Payer: BC Managed Care – PPO

## 2022-03-27 ENCOUNTER — Other Ambulatory Visit: Payer: Self-pay | Admitting: Gynecology

## 2022-03-27 DIAGNOSIS — Z9189 Other specified personal risk factors, not elsewhere classified: Secondary | ICD-10-CM

## 2022-04-16 ENCOUNTER — Ambulatory Visit: Payer: BC Managed Care – PPO

## 2022-04-29 ENCOUNTER — Ambulatory Visit
Admission: RE | Admit: 2022-04-29 | Discharge: 2022-04-29 | Disposition: A | Discharge status: Home / Self Care | Payer: BC Managed Care – PPO | Source: Ambulatory Visit | Attending: Gynecology | Admitting: Gynecology

## 2022-04-29 DIAGNOSIS — Z9189 Other specified personal risk factors, not elsewhere classified: Secondary | ICD-10-CM

## 2022-04-29 IMAGING — MR MR BREAST BILAT WO/W CM
8 of 12 series · 33 of 48 positions shown · IV contrast (5 ml gadavist)
Comparison: Previous MRI on [DATE]; patient has declined
mammography.

CLINICAL DATA: Family history of breast cancer, patient's mother
was diagnosed at age 70. The LEFT breast is itchy. Patient declines
mammography due to implants.

EXAM:
BILATERAL BREAST MRI WITH AND WITHOUT CONTRAST
TECHNIQUE: Multiplanar, multisequence MR images of both breasts were obtained
prior to and following the intravenous administration of 5 ml of
Gadavist

[Series 2: t2_tirm_tra ipat (a-p) · axial · 3.0mm · 0.70mm/px · 1 of 55 slices shown]
[im 1/55]
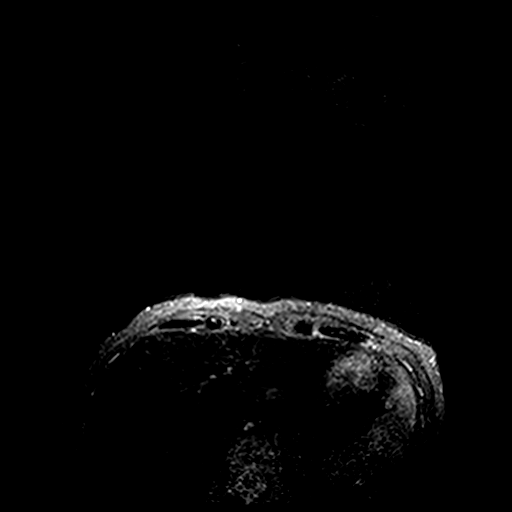

[Series 3: fl3d pre-cm no · axial · non-contrast · 1.2mm · 0.89mm/px · z∈[-95,+77]mm · 5 of 144 slices shown]
[im 1/144]
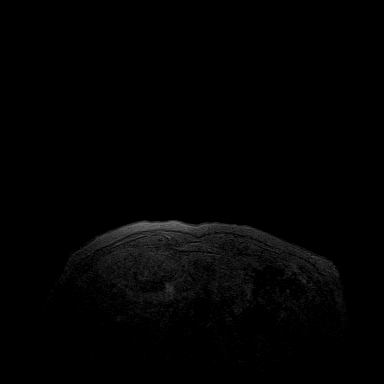
[im 36/144]
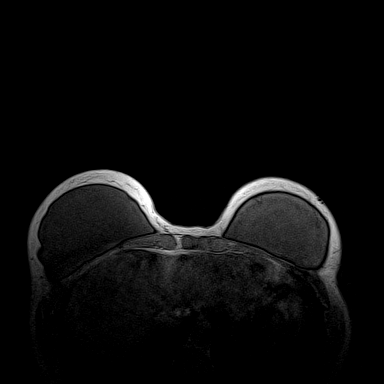
[im 72/144]
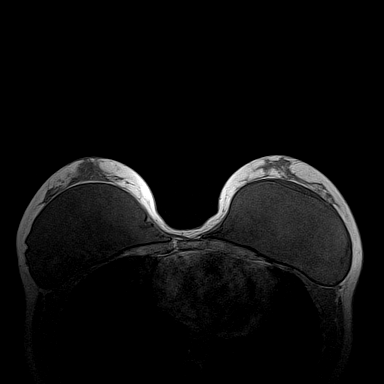
[im 108/144]
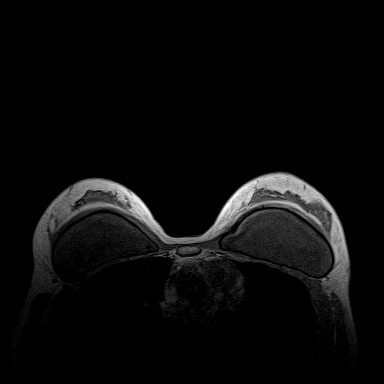
[im 144/144]
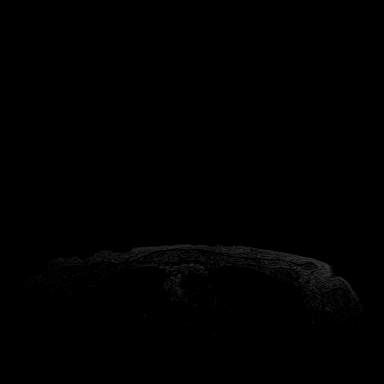

[Series 4: fl3d pre-cm · axial · non-contrast · 1.2mm · 0.89mm/px · z∈[-95,+77]mm · 5 of 144 slices shown]
[im 1/144]
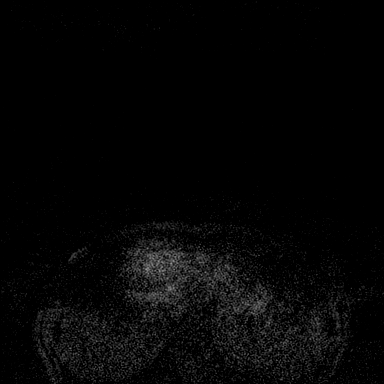
[im 36/144]
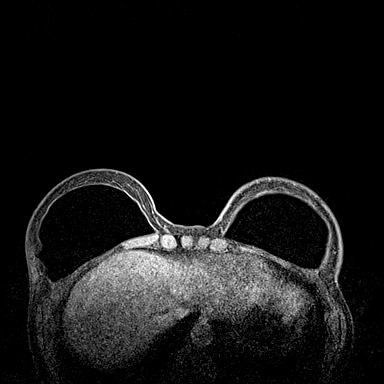
[im 72/144]
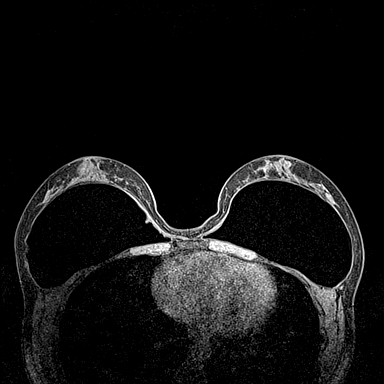
[im 108/144]
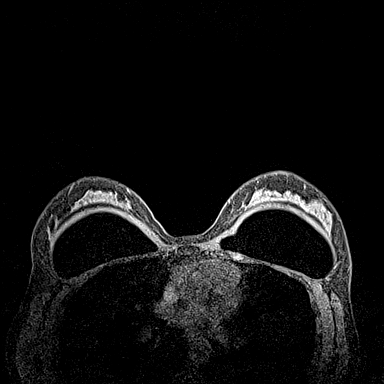
[im 144/144]
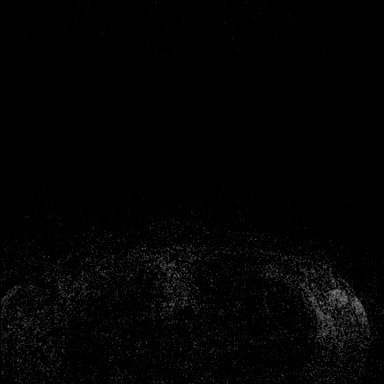

[Series 5: fl3d post-cm 20 · axial · 1.2mm · 0.89mm/px · z∈[-95,+77]mm · 5 of 144 slices shown (1 of 3)]
[im 1/144]
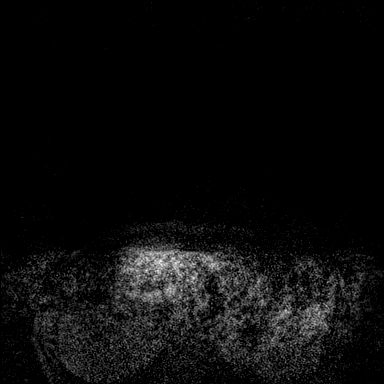
[im 36/144]
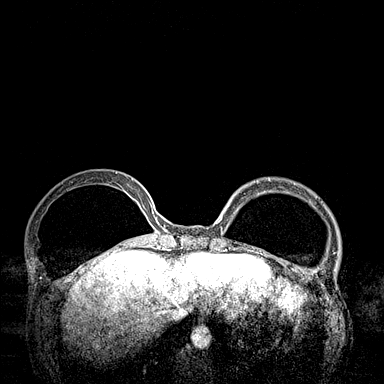
[im 72/144]
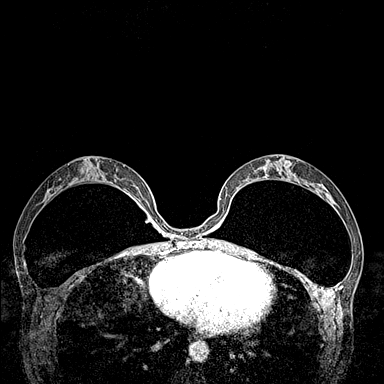
[im 108/144]
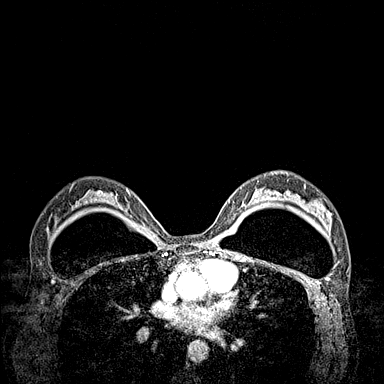
[im 144/144]
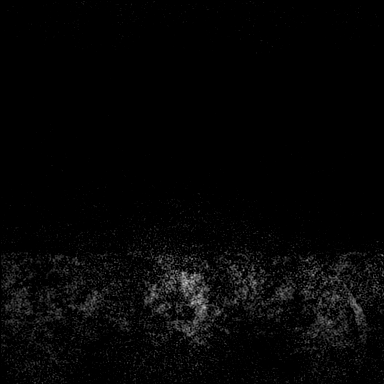

[Series 6: fl3d post-cm 20 · axial · 1.2mm · 0.89mm/px · z∈[-95,+77]mm · 5 of 144 slices shown (2 of 3)]
[im 1/144]
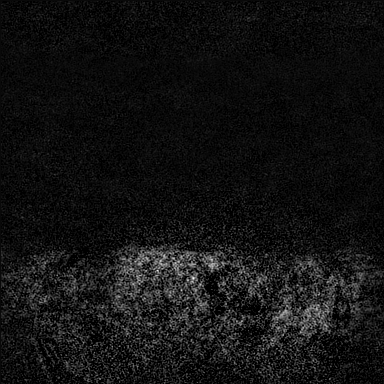
[im 36/144]
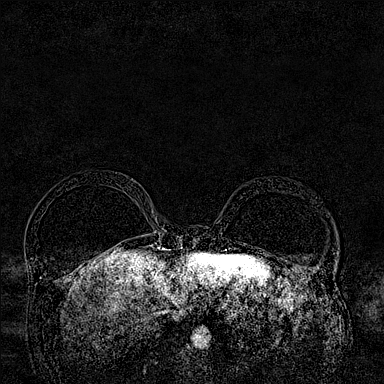
[im 72/144]
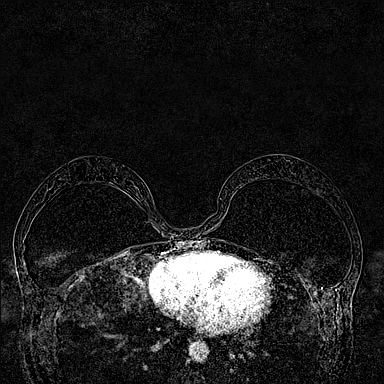
[im 108/144]
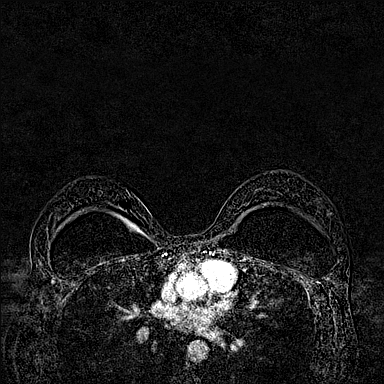
[im 144/144]
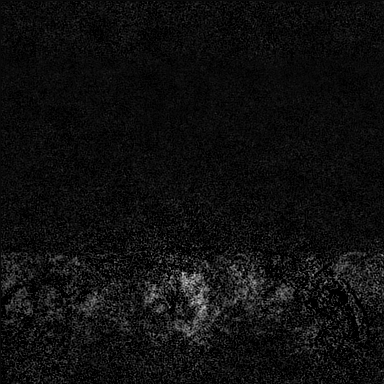

[Series 7: fl3d post-cm 20 · axial · 172.8mm · 0.89mm/px · 1 of 1 slices shown (3 of 3)]
[im 1/1]
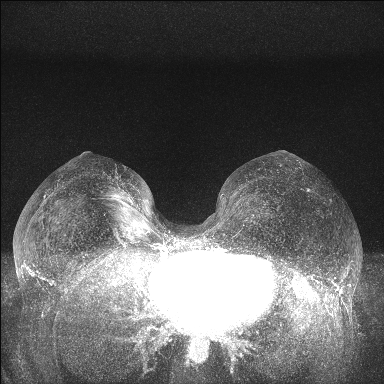

[Series 8: fl3d post-cm 3 · axial · 1.2mm · 0.89mm/px · z∈[-95,+77]mm · 6 of 144 slices shown (1 of 2)]
[im 1/144]
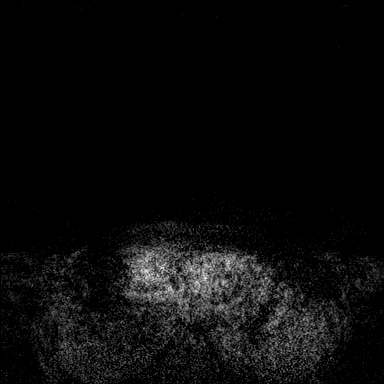
[im 29/144]
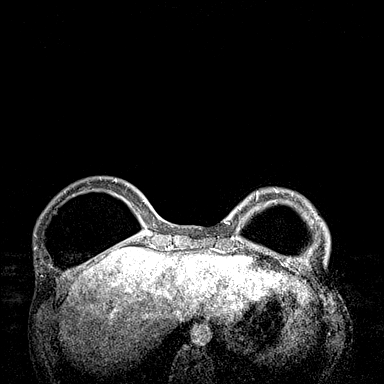
[im 58/144]
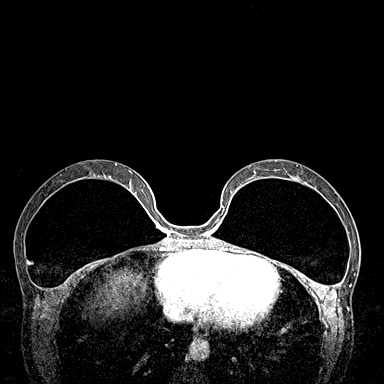
[im 86/144]
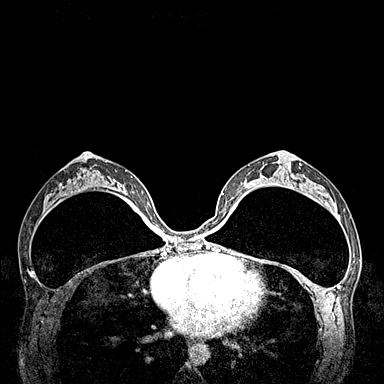
[im 115/144]
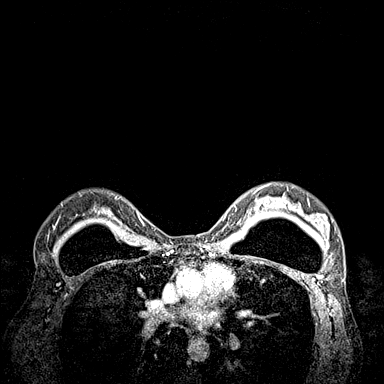
[im 144/144]
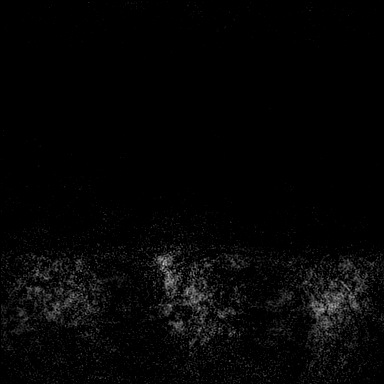

[Series 9: fl3d post-cm 3 · axial · 1.2mm · 0.89mm/px · z∈[-95,+42]mm · 5 of 144 slices shown (2 of 2)]
[im 1/144]
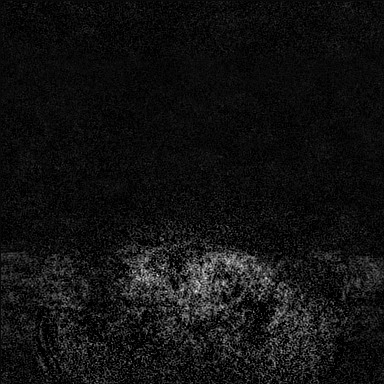
[im 29/144]
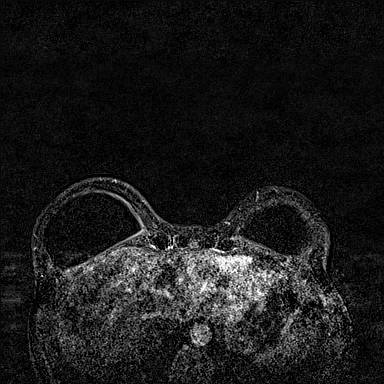
[im 58/144]
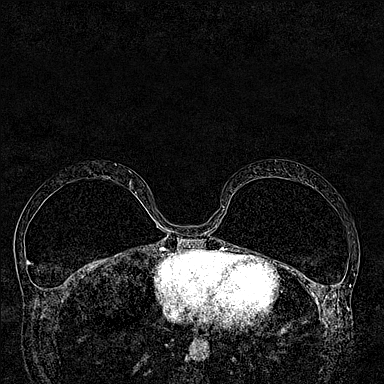
[im 86/144]
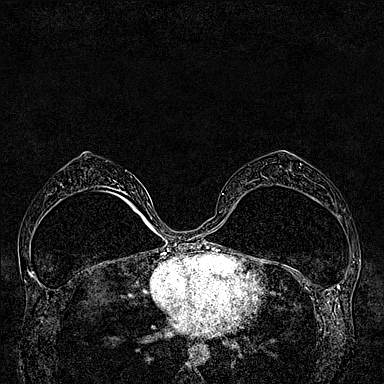
[im 115/144]
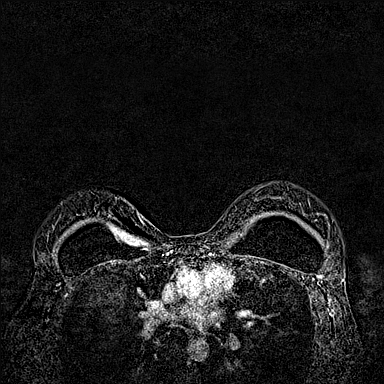

[33 of 48 positions shown; findings below may reference images not displayed]

Three-dimensional MR images were rendered by post-processing of the
original MR data on an independent workstation. The
three-dimensional MR images were interpreted, and findings are
reported in the following complete MRI report for this study. Three
dimensional images were evaluated at the independent interpreting
workstation using the DynaCAD thin client.
FINDINGS: Breast composition: c. Heterogeneous fibroglandular tissue.

Background parenchymal enhancement: Minimal

Right breast: No mass or abnormal enhancement. Retropectoral
silicone implant. Study is not performed with silicone specific
sequences to assess integrity of implant.

Left breast: No mass or abnormal enhancement. Retropectoral silicone
implant. Study is not performed with silicone specific sequences to
assess integrity of the implant.

Lymph nodes: No abnormal appearing lymph nodes.

Ancillary findings:  None.
IMPRESSION: 1. No MRI evidence for malignancy.
2. Bilateral retropectoral silicone implants. Implant integrity is
not assessed on this exam.

RECOMMENDATION:
Recommend annual screening mammography in addition to MRI.

BI-RADS CATEGORY  1: Negative.

## 2022-04-29 MED ORDER — GADOBUTROL 1 MMOL/ML IV SOLN
5.0000 mL | Freq: Once | INTRAVENOUS | Status: AC | PRN
  Administered 2022-04-29: 5 mL via INTRAVENOUS

## 2023-07-14 ENCOUNTER — Other Ambulatory Visit: Payer: Self-pay | Admitting: Gynecology

## 2023-07-14 DIAGNOSIS — R9230 Dense breasts, unspecified: Secondary | ICD-10-CM

## 2023-09-29 ENCOUNTER — Other Ambulatory Visit: Payer: Self-pay | Admitting: Gynecology

## 2023-09-29 ENCOUNTER — Ambulatory Visit
Admission: RE | Admit: 2023-09-29 | Discharge: 2023-09-29 | Disposition: A | Discharge status: Home / Self Care | Payer: BC Managed Care – PPO | Source: Ambulatory Visit | Attending: Gynecology | Admitting: Gynecology

## 2023-09-29 DIAGNOSIS — R928 Other abnormal and inconclusive findings on diagnostic imaging of breast: Secondary | ICD-10-CM

## 2023-09-29 DIAGNOSIS — R9230 Dense breasts, unspecified: Secondary | ICD-10-CM

## 2023-09-29 MED ORDER — GADOPICLENOL 0.5 MMOL/ML IV SOLN
6.0000 mL | Freq: Once | INTRAVENOUS | Status: AC | PRN
  Administered 2023-09-29: 6 mL via INTRAVENOUS

## 2023-09-30 ENCOUNTER — Other Ambulatory Visit: Payer: Self-pay | Admitting: Gynecology

## 2023-09-30 DIAGNOSIS — R928 Other abnormal and inconclusive findings on diagnostic imaging of breast: Secondary | ICD-10-CM

## 2023-10-02 ENCOUNTER — Ambulatory Visit
Admission: RE | Admit: 2023-10-02 | Discharge: 2023-10-02 | Disposition: A | Discharge status: Home / Self Care | Payer: BC Managed Care – PPO | Source: Ambulatory Visit | Attending: Gynecology | Admitting: Gynecology

## 2023-10-02 DIAGNOSIS — R928 Other abnormal and inconclusive findings on diagnostic imaging of breast: Secondary | ICD-10-CM

## 2024-04-05 ENCOUNTER — Other Ambulatory Visit: Payer: Self-pay | Admitting: Gynecology

## 2024-04-05 DIAGNOSIS — Z9189 Other specified personal risk factors, not elsewhere classified: Secondary | ICD-10-CM

## 2024-04-05 DIAGNOSIS — R9230 Dense breasts, unspecified: Secondary | ICD-10-CM

## 2024-05-25 ENCOUNTER — Ambulatory Visit
Admission: RE | Admit: 2024-05-25 | Discharge: 2024-05-25 | Disposition: A | Discharge status: Home / Self Care | Source: Ambulatory Visit | Attending: Gynecology | Admitting: Gynecology

## 2024-05-25 ENCOUNTER — Other Ambulatory Visit: Payer: Self-pay | Admitting: Gynecology

## 2024-05-25 DIAGNOSIS — Z9189 Other specified personal risk factors, not elsewhere classified: Secondary | ICD-10-CM

## 2024-05-25 DIAGNOSIS — R928 Other abnormal and inconclusive findings on diagnostic imaging of breast: Secondary | ICD-10-CM

## 2024-05-25 DIAGNOSIS — R9230 Dense breasts, unspecified: Secondary | ICD-10-CM

## 2024-05-25 MED ORDER — GADOPICLENOL 0.5 MMOL/ML IV SOLN
6.0000 mL | Freq: Once | INTRAVENOUS | Status: AC | PRN
  Administered 2024-05-25: 6 mL via INTRAVENOUS

## 2024-05-27 ENCOUNTER — Ambulatory Visit
Admission: RE | Admit: 2024-05-27 | Discharge: 2024-05-27 | Discharge status: Home / Self Care | Source: Ambulatory Visit | Attending: Gynecology | Admitting: Gynecology

## 2024-05-27 DIAGNOSIS — R928 Other abnormal and inconclusive findings on diagnostic imaging of breast: Secondary | ICD-10-CM

## 2024-05-31 ENCOUNTER — Other Ambulatory Visit
